# Patient Record
Sex: Female | Born: 1995 | Race: White | Hispanic: No | Marital: Single | Smoking: Current every day smoker
Health system: Southern US, Community
[De-identification: ages and names within clinical notes are randomized; demographics above are authoritative.]

## PROBLEM LIST (undated history)

## (undated) DIAGNOSIS — F1910 Other psychoactive substance abuse, uncomplicated: Secondary | ICD-10-CM

## (undated) DIAGNOSIS — S82143A Displaced bicondylar fracture of unspecified tibia, initial encounter for closed fracture: Secondary | ICD-10-CM

## (undated) DIAGNOSIS — Z789 Other specified health status: Secondary | ICD-10-CM

## (undated) HISTORY — PX: NO PAST SURGERIES: SHX2092

## (undated) HISTORY — PX: BARTHOLIN GLAND CYST EXCISION: SHX565

## (undated) HISTORY — PX: BILATERAL SALPINGECTOMY: SHX5743

---
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
## 2016-01-19 IMAGING — US US OB FOLLOW-UP
1 series · 12 of 28 positions shown · non-contrast
Comparison: none

[Series 1: us ob follow-up · 0.23mm/px · 12 of 44 slices shown]
[im 2/44]
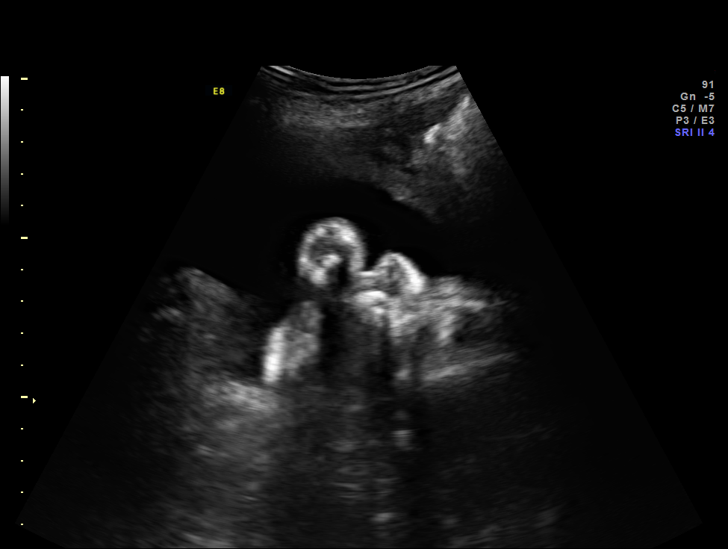
[im 5/44]
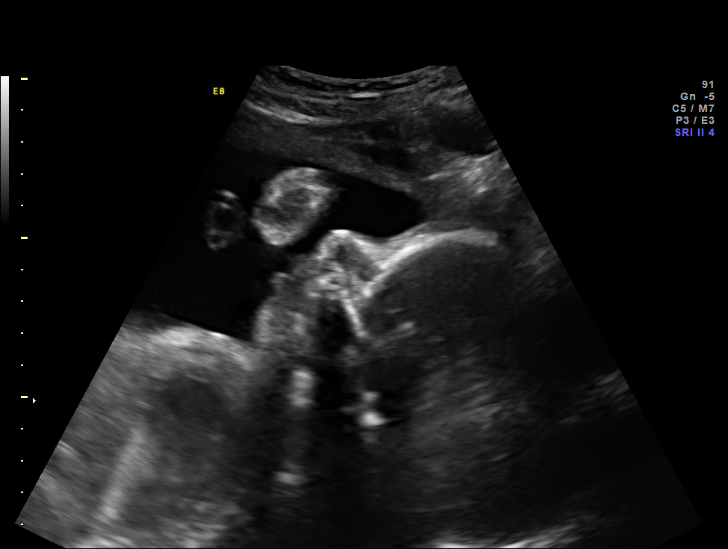
[im 8/44]
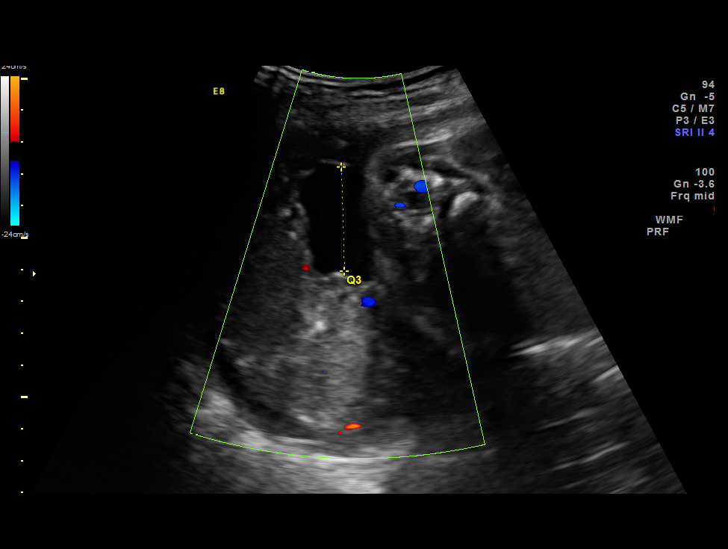
[im 13/44]
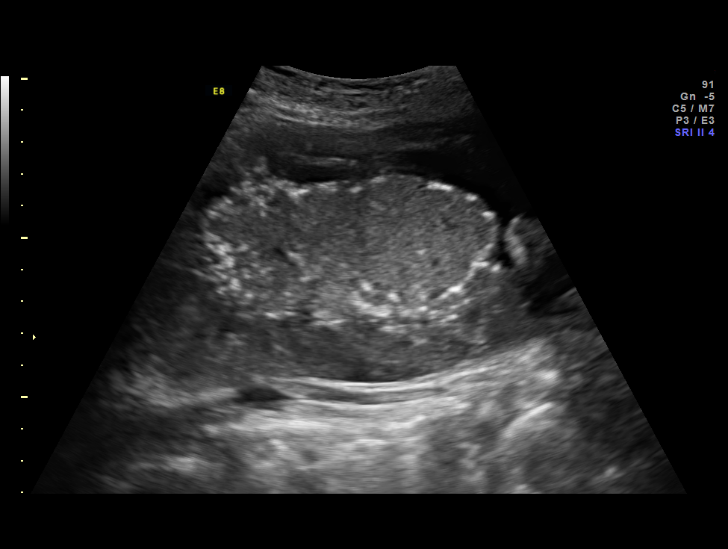
[im 16/44]
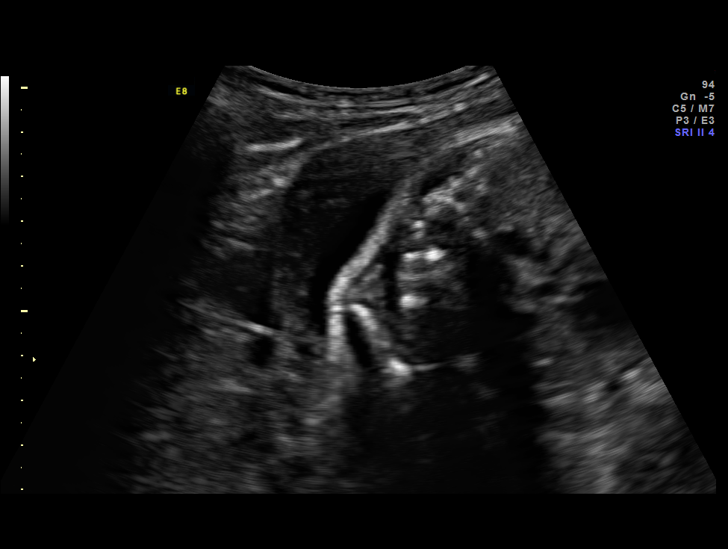
[im 20/44]
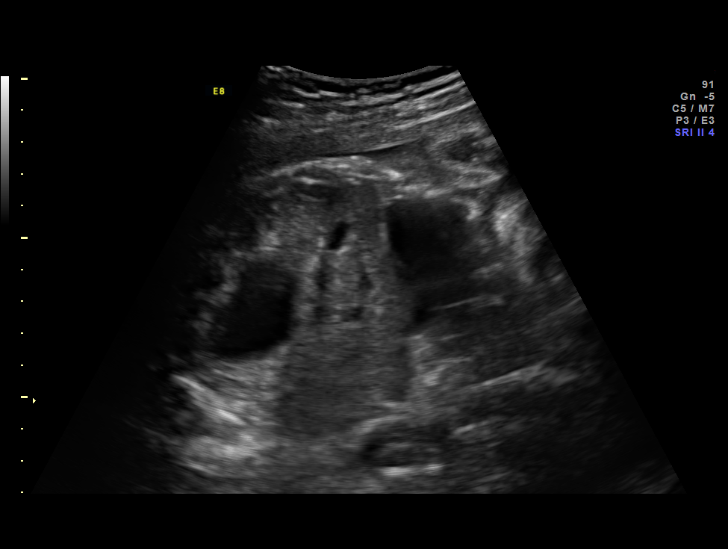
[im 24/44]
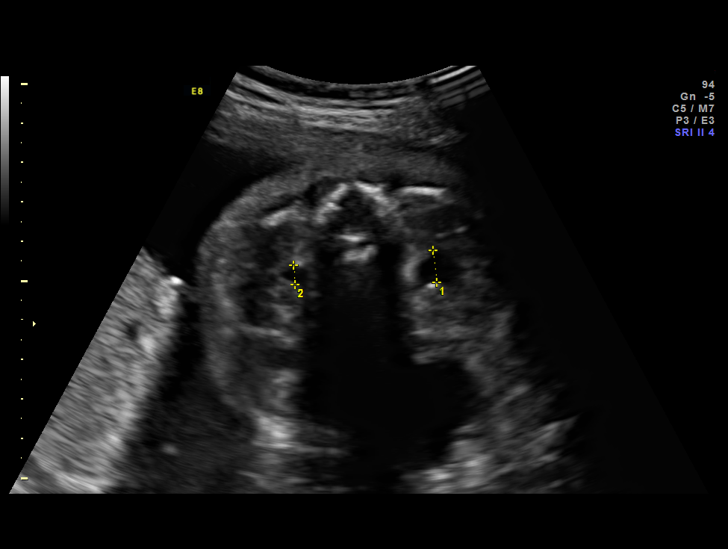
[im 28/44]
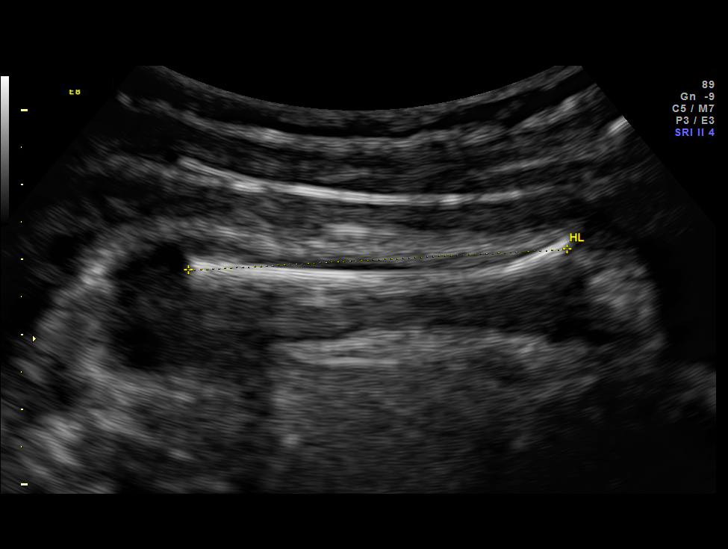
[im 31/44]
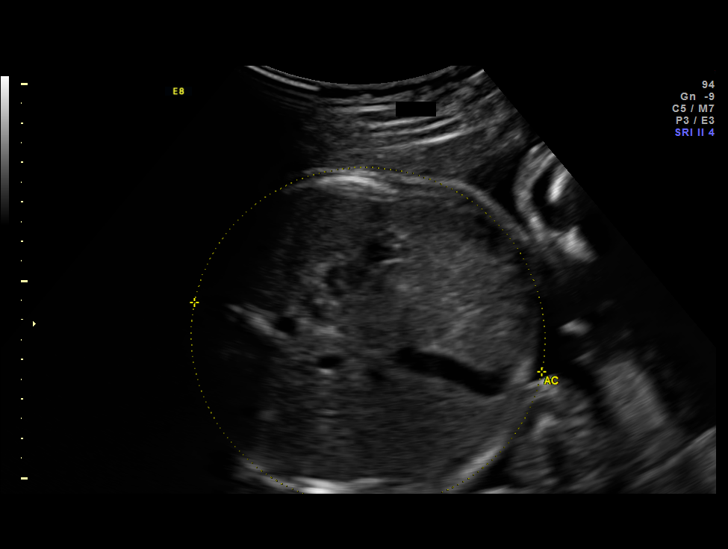
[im 36/44]
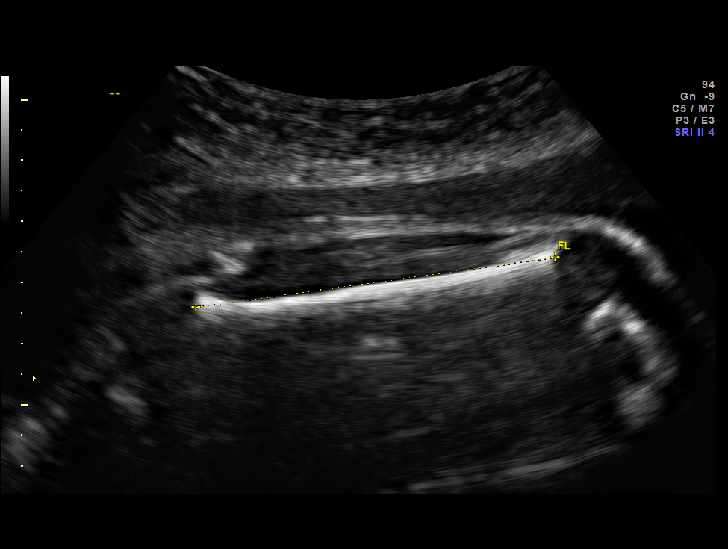
[im 39/44]
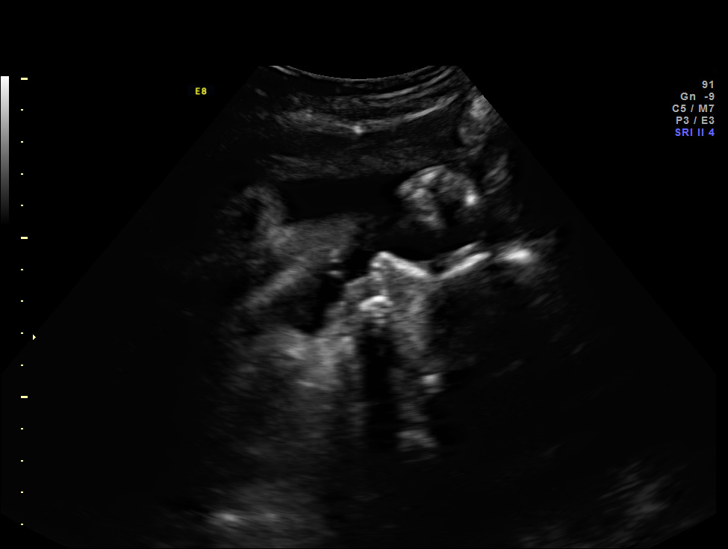
[im 42/44]
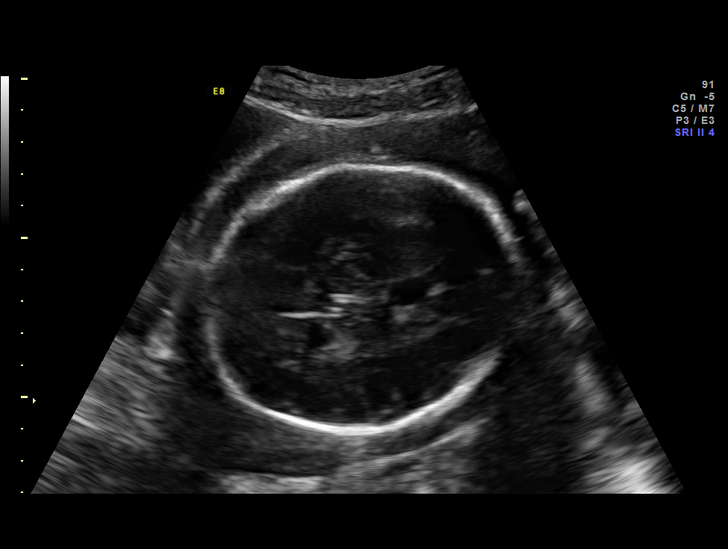

[12 of 28 positions shown; findings below may reference images not displayed]

OBSTETRICS REPORT
                      (Signed Final 01/22/2014 [DATE])

Service(s) Provided

 US OB FOLLOW UP                                       76816.1
Indications

 Hypoplastic left heart (confirmed with fetal ECHO)
 Pyelectasis of fetus on prenatal ultrasound           655.83 593.89,

 Low risk NIPS (trisomies and 22q66)
Fetal Evaluation

 Num Of Fetuses:    1
 Fetal Heart Rate:  127                          bpm
 Cardiac Activity:  Observed
 Presentation:      Cephalic
 Placenta:          Fundal, above cervical os
 P. Cord            Previously Visualized
 Insertion:

 Amniotic Fluid
 AFI FV:      Subjectively within normal limits
 AFI Sum:     15.89   cm       57  %Tile     Larg Pckt:     7.3  cm
 RUQ:   7.3     cm   LUQ:    5.31   cm    LLQ:   3.28    cm
Biometry

 BPD:     79.6  mm     G. Age:  32w 0d                CI:         77.1   70 - 86
 OFD:    103.3  mm                                    FL/HC:      19.8   19.9 -

 HC:       294  mm     G. Age:  32w 4d       14  %    HC/AC:      1.05   0.96 -

 AC:     279.2  mm     G. Age:  32w 0d       33  %    FL/BPD:     73.1   71 - 87
 FL:      58.2  mm     G. Age:  30w 3d      < 3  %    FL/AC:      20.8   20 - 24
 HUM:     51.3  mm     G. Age:  30w 0d      < 5  %
 Est. FW:    3417  gm    3 lb 15 oz      37  %
Gestational Age

 LMP:           34w 5d        Date:  05/24/13                 EDD:   02/28/14
 U/S Today:     31w 5d                                        EDD:   03/21/14
 Best:          32w 4d     Det. By:  Early Ultrasound         EDD:   03/15/14
                                     (10/21/13)
Anatomy

 Cranium:          Appears normal         Diaphragm:        Appears normal
 Fetal Cavum:      Appears normal         Stomach:          Appears normal
 Ventricles:       Appears normal         Abdomen:          Appears normal
 Choroid Plexus:   Previously seen        Abdominal Wall:   Previously seen
 Cerebellum:       Previously seen        Cord Vessels:     Previously seen
 Posterior Fossa:  Appears normal         Kidneys:          Left sided
                                                            pyelectasis, 8 mm
 Nuchal Fold:      Not applicable (>20    Bladder:          Appears normal
                   wks GA)
 Face:             Orbits and profile     Spine:            Previously seen
                   previously seen
 Lips:             Previously seen        Lower             Previously seen
                                          Extremities:
 Heart:            Hypoplastic left       Upper             Previously seen
                   heart
                                          Extremities:

 Other:  Fetus appears to be a male. Heels and 5th digit previously visualized.
Cervix Uterus Adnexa

 Cervix:       Not visualized (advanced GA >72wks)
 Uterus:       No abnormality visualized.
 Cul De Sac:   No free fluid seen.
 Left Ovary:    Not visualized.
 Right Ovary:   Not visualized.
 Adnexa:     No abnormality visualized.
Impression

 SIUP at 32+4 weeks
 Hypoplastic left heart syndrome; mild renal pelvis dilation on
 left
 All other interval fetal anatomy was seen and appeared
 normal
 Normal amniotic fluid volume
 Appropriate interval growth with EFW at the 37th %tile
Recommendations

 Follow-up ultrasound for growth in 4 weeks
 Next ECHO at [REDACTED]

## 2017-02-18 IMAGING — RF DG C-ARM 61-120 MIN
1 series · 7 of 7 positions shown · non-contrast
Comparison: CT scan of the left knee February 09, 2015

CLINICAL DATA: ORIF for left lateral tibial plateau fracture

EXAM:
DG C-ARM 61-120 MIN; LEFT KNEE - COMPLETE 4+ VIEW
FLUOROSCOPY TIME:  Fluoroscopy Time (in minutes and seconds): 1
minutes, 14 seconds
Number of Acquired Images:  7

[Series 1: run · 7 of 7 slices shown]
[im 1/7]
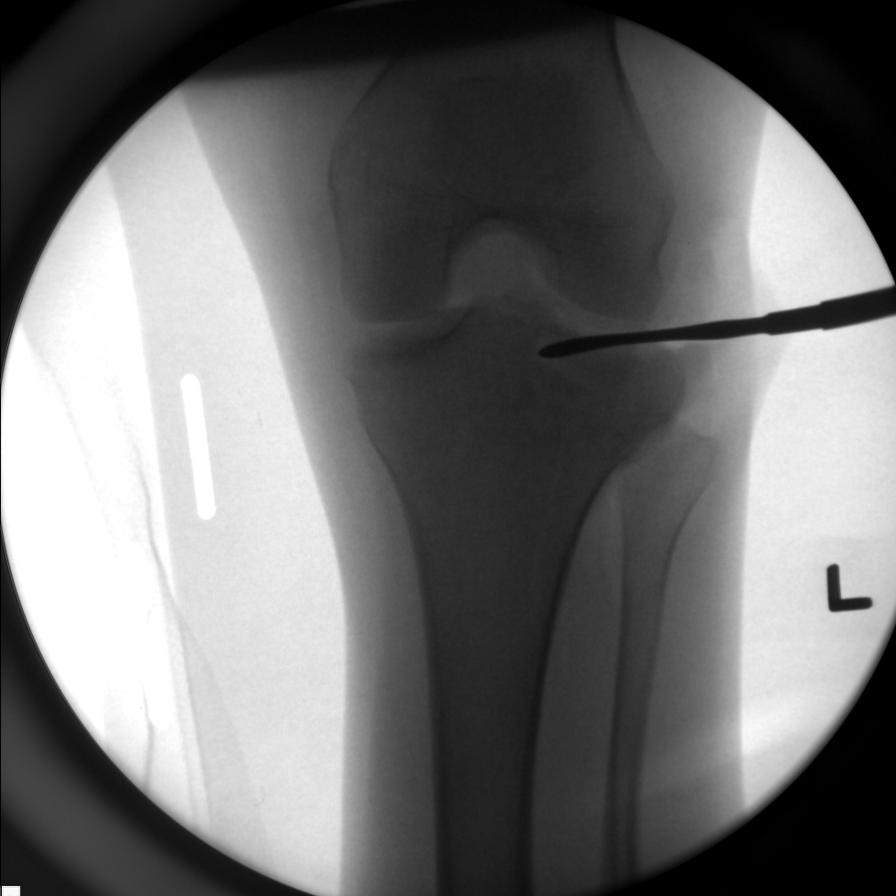
[im 2/7]
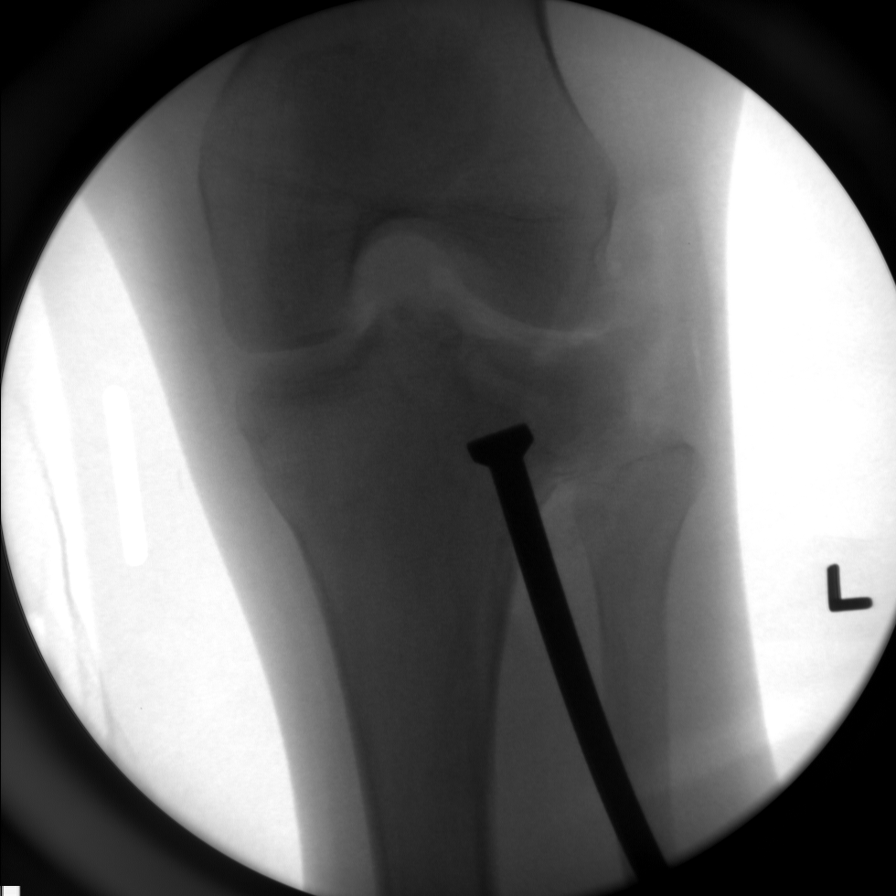
[im 3/7]
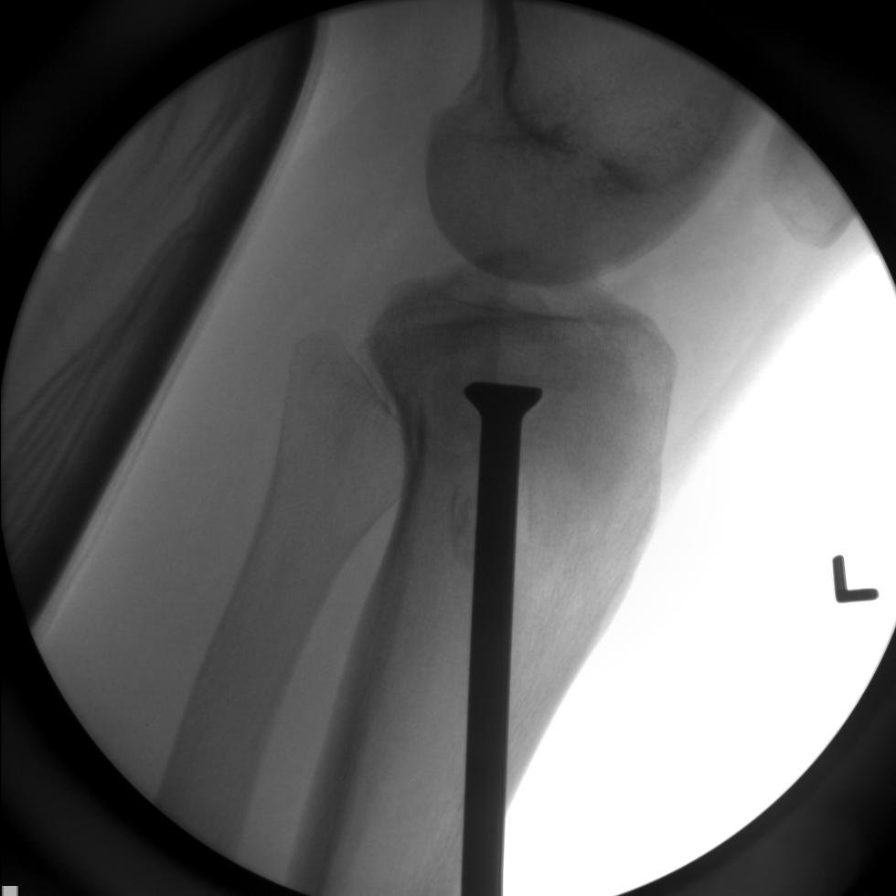
[im 4/7]
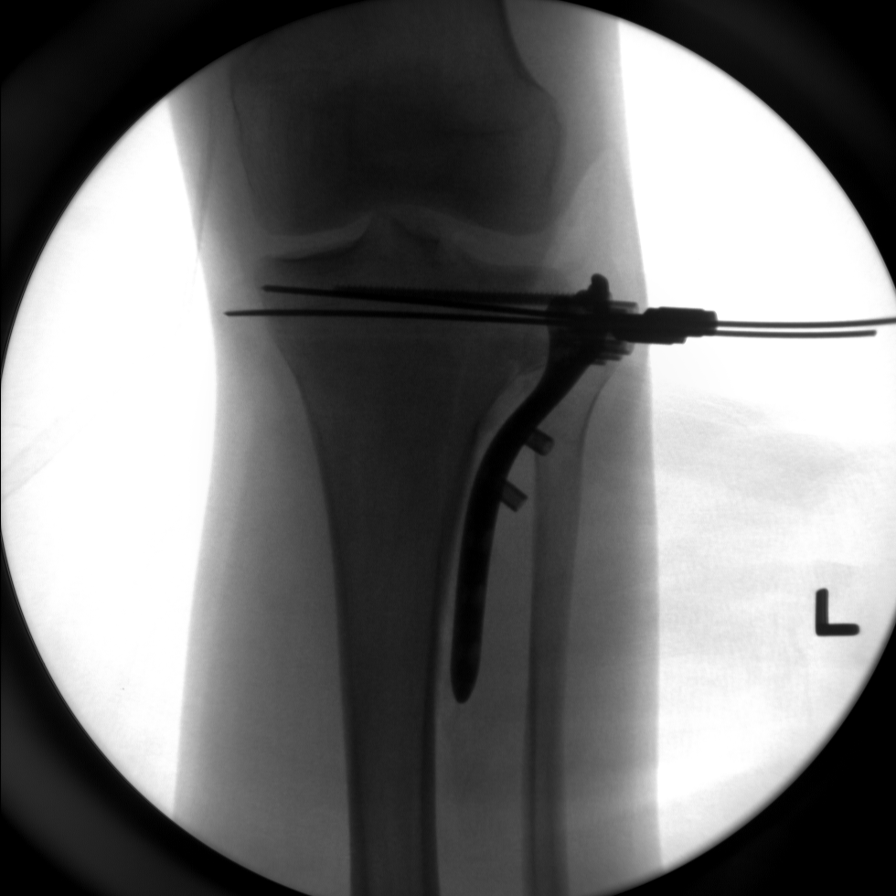
[im 5/7]
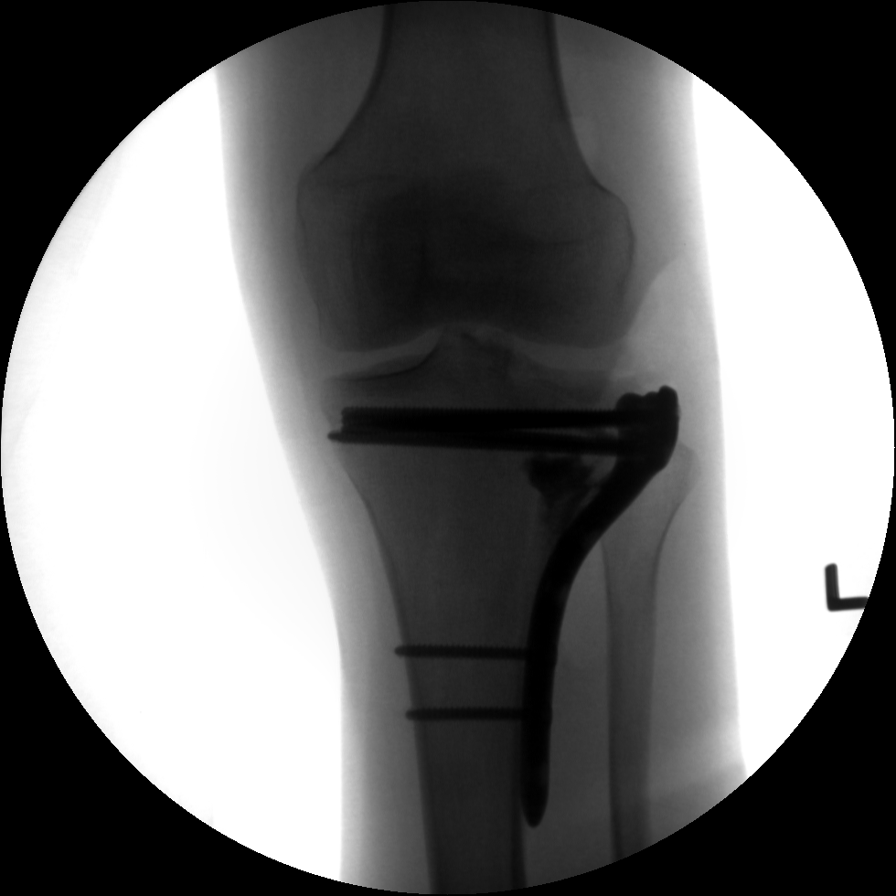
[im 6/7]
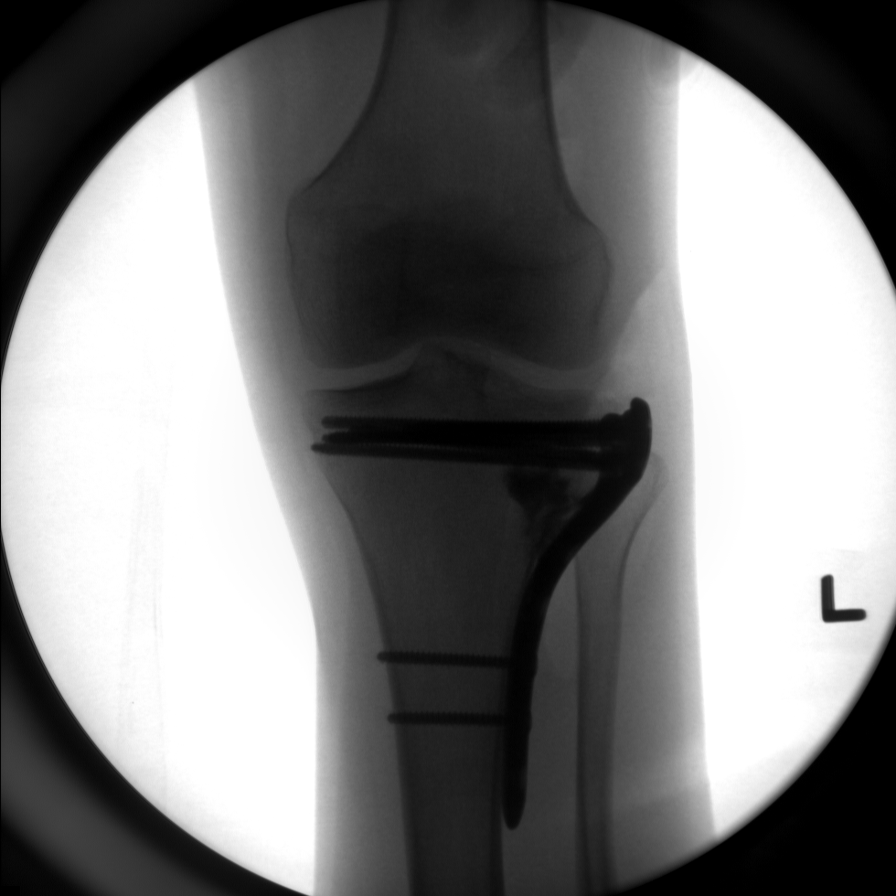
[im 7/7]
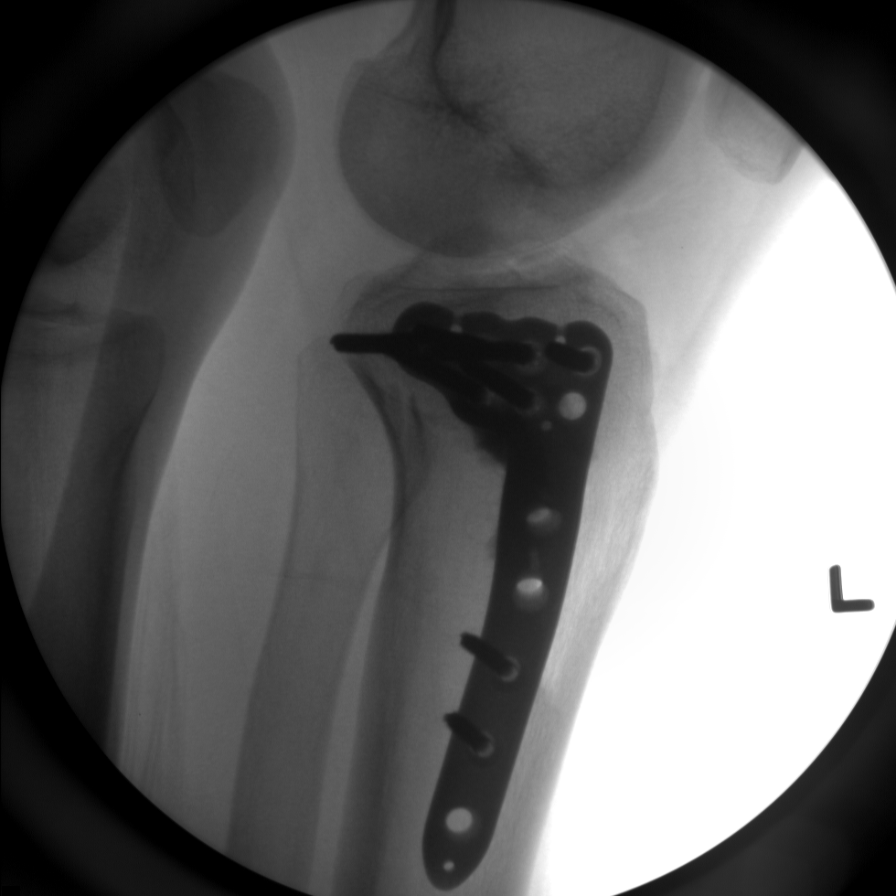

[7 of 7 positions shown; findings below may reference images not displayed]

FINDINGS: This series of fluoro spot images reveals elevation of the depressed
tibial plateau fracture with placement of stabilizing screws and
sideplate along the lateral aspect of the proximal tibia. Radiodense
cement is noted along the inferior aspect of the proximal left
tibial metaphysis laterally.
IMPRESSION: The patient has undergone successful elevation and ORIF of the
depressed tibial plateau fracture. There is no evidence of immediate
postprocedure complication.

## 2021-10-02 ENCOUNTER — Encounter (HOSPITAL_BASED_OUTPATIENT_CLINIC_OR_DEPARTMENT_OTHER): Payer: Self-pay | Admitting: Emergency Medicine

## 2021-10-02 ENCOUNTER — Emergency Department (HOSPITAL_BASED_OUTPATIENT_CLINIC_OR_DEPARTMENT_OTHER)
Admission: EM | Admit: 2021-10-02 | Discharge: 2021-10-02 | Disposition: A | Discharge status: Home / Self Care | Payer: Medicaid Other | Attending: Emergency Medicine | Admitting: Emergency Medicine

## 2021-10-02 ENCOUNTER — Other Ambulatory Visit: Payer: Self-pay

## 2021-10-02 DIAGNOSIS — Z7982 Long term (current) use of aspirin: Secondary | ICD-10-CM | POA: Diagnosis not present

## 2021-10-02 DIAGNOSIS — N7689 Other specified inflammation of vagina and vulva: Secondary | ICD-10-CM | POA: Diagnosis not present

## 2021-10-02 DIAGNOSIS — N9489 Other specified conditions associated with female genital organs and menstrual cycle: Secondary | ICD-10-CM

## 2021-10-02 MED ORDER — OXYCODONE-ACETAMINOPHEN 5-325 MG PO TABS: 1.0000 | ORAL_TABLET | Freq: Four times a day (QID) | ORAL | 0 refills | Status: DC | PRN

## 2021-10-02 MED ORDER — CLINDAMYCIN HCL 300 MG PO CAPS: 300.0000 mg | ORAL_CAPSULE | Freq: Four times a day (QID) | ORAL | 0 refills | Status: AC

## 2021-10-02 NOTE — ED Triage Notes (Signed)
Pt presents to ED POV. Pt c/o abscess on R labia. Pt reports that it started 1.34m and worsened today.

## 2021-10-02 NOTE — ED Provider Notes (Signed)
[facility] EMERGENCY DEPARTMENT Provider Note   CSN: [id number] Arrival date & time: 10/02/21  0042     History  Chief Complaint  Patient presents with   Abscess    [name] is a 26 y.o. female.  Patient here with complaints of large area of swelling to her right labia.  Has been progressively worsening for the last few days.  This significantly worsened today.  No fevers.  No drainage.  No rashes.  She states that some similar happen when she was pregnant and they told her that it was a lymph node.  She states that they were going to do something about it when she gave birth about that time and had improved.  She states that she also has some pain up in her inguinal canal area.   Abscess     Home Medications Prior to Admission medications   Medication Sig Start Date End Date Taking? Authorizing Provider  clindamycin (CLEOCIN) 300 MG capsule Take 1 capsule (300 mg total) by mouth 4 (four) times daily. X 7 days 10/02/21  Yes Rohan Juenger, Barbara Cower, MD  oxyCODONE-acetaminophen (PERCOCET) 5-325 MG tablet Take 1 tablet by mouth every 6 (six) hours as needed for severe pain. 10/02/21  Yes Iyona Pehrson, Barbara Cower, MD  aspirin EC 325 MG EC tablet Take 1 tablet (325 mg total) by mouth 2 (two) times daily. 02/24/15   Montez Morita, PA-C  cholecalciferol 1000 UNITS tablet Take 1 tablet (1,000 Units total) by mouth 2 (two) times daily. 02/24/15   Montez Morita, PA-C  docusate sodium (COLACE) 100 MG capsule Take 1 capsule (100 mg total) by mouth 2 (two) times daily. 02/24/15   Montez Morita, PA-C      Allergies    Tramadol    Review of Systems   Review of Systems  Physical Exam Updated Vital Signs BP 114/82 (BP Location: Right Arm)    Pulse 78    Temp 98 F (36.7 C) (Oral)    Resp 16    Ht 5\' 4"  (1.626 m)    SpO2 99%    BMI 18.28 kg/m  Physical Exam Vitals and nursing note reviewed. Exam conducted with a chaperone present.  Constitutional:      Appearance: She is well-developed.  HENT:      Head: Normocephalic and atraumatic.     Mouth/Throat:     Mouth: Mucous membranes are moist.     Pharynx: Oropharynx is clear.  Eyes:     Pupils: Pupils are equal, round, and reactive to light.  Cardiovascular:     Rate and Rhythm: Normal rate and regular rhythm.  Pulmonary:     Effort: No respiratory distress.     Breath sounds: No stridor.  Abdominal:     General: Abdomen is flat. There is no distension.  Genitourinary:      Comments: Chaperoned by .  Significant swelling to her right labia without obvious fluctuant area.  Mildly erythematous.  She also has lymphadenopathy in her right inguinal canal.  No vaginal discharge. Musculoskeletal:     Cervical back: Normal range of motion.  Skin:    General: Skin is warm and dry.  Neurological:     General: No focal deficit present.     Mental Status: She is alert.    ED Results / Procedures / Treatments   Labs (all labs ordered are listed, but only abnormal results are displayed) Labs Reviewed - No data to display  EKG None  Radiology No results found.  Procedures Procedures   Medications Ordered in ED Medications - No data to display  ED Course/ Medical Decision Making/ A&P                           Medical Decision Making  Not entirely sure that this is a Bartholin gland cyst or abscess.  The amount of swelling is well beyond where her Bartholin gland would be.  She does have lymphadenopathy proximal to it and swelling and tenderness with mild erythema concerning for possible infection.  I do not see an area that I am confident in performing I&D at this time.  Will refer to gynecology for follow-up.  Antibiotics given for the meantime    Final Clinical Impression(s) / ED Diagnoses Final diagnoses:  Labial swelling    Rx / DC Orders ED Discharge Orders          Ordered    oxyCODONE-acetaminophen (PERCOCET) 5-325 MG tablet  Every 6 hours PRN        10/02/21 0456    clindamycin (CLEOCIN) 300 MG  capsule  4 times daily        10/02/21 0456              Leanore Biggers, Barbara Cower, MD 10/02/21 3893

## 2021-10-02 NOTE — ED Notes (Signed)
Patient discharged to home.  All discharge instructions reviewed.  Patient verbalized understanding via teachback method.  VS WDL.  Respirations even and unlabored.  Ambulatory out of ED.   °

## 2021-10-02 NOTE — ED Notes (Signed)
Chaperoned ED physician during pelvic exam.

## 2021-10-02 NOTE — ED Notes (Signed)
Patient resting in bed with eyes closed.  Respirations even and unlabored.  Pelvic cart and I&D tray to bedside.

## 2021-10-02 NOTE — ED Notes (Signed)
Patient sleeping in chair in lobby.  Attempted to wake up patient but she did not respond to verbal stimuli.  Required vigorously shaking her harm to wake her up.  Independent with ambulation to room.  Patient encouraged to put on hospital gown.  Denies further needs at this time.

## 2021-10-03 ENCOUNTER — Other Ambulatory Visit: Payer: Self-pay

## 2021-10-03 ENCOUNTER — Emergency Department (HOSPITAL_BASED_OUTPATIENT_CLINIC_OR_DEPARTMENT_OTHER)
Admission: EM | Admit: 2021-10-03 | Discharge: 2021-10-03 | Disposition: A | Discharge status: Home / Self Care | Payer: Medicaid Other | Attending: Emergency Medicine | Admitting: Emergency Medicine

## 2021-10-03 ENCOUNTER — Encounter (HOSPITAL_BASED_OUTPATIENT_CLINIC_OR_DEPARTMENT_OTHER): Payer: Self-pay

## 2021-10-03 DIAGNOSIS — N750 Cyst of Bartholin's gland: Secondary | ICD-10-CM | POA: Diagnosis not present

## 2021-10-03 DIAGNOSIS — Z7982 Long term (current) use of aspirin: Secondary | ICD-10-CM | POA: Insufficient documentation

## 2021-10-03 DIAGNOSIS — N7689 Other specified inflammation of vagina and vulva: Secondary | ICD-10-CM | POA: Diagnosis present

## 2021-10-03 DIAGNOSIS — R590 Localized enlarged lymph nodes: Secondary | ICD-10-CM | POA: Diagnosis not present

## 2021-10-03 DIAGNOSIS — N9489 Other specified conditions associated with female genital organs and menstrual cycle: Secondary | ICD-10-CM

## 2021-10-03 MED ORDER — OXYCODONE-ACETAMINOPHEN 5-325 MG PO TABS: 1.0000 | ORAL_TABLET | Freq: Three times a day (TID) | ORAL | 0 refills | Status: AC | PRN

## 2021-10-03 MED ORDER — OXYCODONE-ACETAMINOPHEN 5-325 MG PO TABS: 1.0000 | ORAL_TABLET | Freq: Three times a day (TID) | ORAL | 0 refills | Status: DC | PRN

## 2021-10-03 MED ORDER — OXYCODONE-ACETAMINOPHEN 5-325 MG PO TABS
1.0000 | ORAL_TABLET | Freq: Once | ORAL | Status: AC
  Administered 2021-10-03: 1 via ORAL
  Filled 2021-10-03: qty 1

## 2021-10-03 MED ORDER — LIDOCAINE HCL (PF) 1 % IJ SOLN
5.0000 mL | Freq: Once | INTRAMUSCULAR | Status: AC
  Administered 2021-10-03: 5 mL
  Filled 2021-10-03: qty 5

## 2021-10-03 NOTE — ED Provider Notes (Signed)
[facility] EMERGENCY DEPARTMENT Provider Note   CSN: [id number] Arrival date & time: 10/03/21  1939     History  Chief Complaint  Patient presents with   Vaginal Pain    [name] is a 26 y.o. female.   Vaginal Pain Patient presents with labial pain and swelling.  Had been seen yesterday for same.  Started on antibiotics and pain medicines.  States continued pain and swelling.  No relief.  Began a few days ago.  No drainage.  States she had similar symptoms 5 months ago when she was pregnant and it resolved on its own.  Also has some swelling in her groin with lymph nodes.     Home Medications Prior to Admission medications   Medication Sig Start Date End Date Taking? Authorizing Provider  aspirin EC 325 MG EC tablet Take 1 tablet (325 mg total) by mouth 2 (two) times daily. 02/24/15   Montez Morita, PA-C  cholecalciferol 1000 UNITS tablet Take 1 tablet (1,000 Units total) by mouth 2 (two) times daily. 02/24/15   Montez Morita, PA-C  clindamycin (CLEOCIN) 300 MG capsule Take 1 capsule (300 mg total) by mouth 4 (four) times daily. X 7 days 10/02/21   Mesner, Barbara Cower, MD  docusate sodium (COLACE) 100 MG capsule Take 1 capsule (100 mg total) by mouth 2 (two) times daily. 02/24/15   Montez Morita, PA-C  oxyCODONE-acetaminophen (PERCOCET) 5-325 MG tablet Take 1-2 tablets by mouth every 8 (eight) hours as needed for severe pain. 10/03/21   Benjiman Core, MD      Allergies    Tramadol    Review of Systems   Review of Systems  Genitourinary:  Positive for vaginal pain. Negative for flank pain.   Physical Exam Updated Vital Signs BP 128/82 (BP Location: Left Arm)    Pulse 93    Temp 98.1 F (36.7 C) (Oral)    Resp 18    Ht 5\' 4"  (1.626 m)    Wt 48 kg    LMP 09/29/2021    SpO2 100%    BMI 18.16 kg/m  Physical Exam Vitals reviewed.  HENT:     Head: Normocephalic.  Abdominal:     Tenderness: There is no abdominal tenderness.  Genitourinary:    Comments: Tender firm  area somewhat deep in the right labia majora.  Approximately 1/2 cm x 2 cm.  No fluctuance. Neurological:     General: No focal deficit present.     Mental Status: She is alert.    ED Results / Procedures / Treatments   Labs (all labs ordered are listed, but only abnormal results are displayed) Labs Reviewed - No data to display  EKG None  Radiology No results found.  Procedures .01/15/2023Incision and Drainage  Date/Time: 10/03/2021 10:04 PM Performed by: 10/05/2021, MD Authorized by: Benjiman Core, MD   Consent:    Consent obtained:  Verbal   Consent given by:  Patient   Risks, benefits, and alternatives were discussed: yes     Risks discussed:  Bleeding, infection, pain, incomplete drainage and damage to other organs   Alternatives discussed:  No treatment and delayed treatment Location:    Type:  Bartholin cyst   Size:  2   Location:  Anogenital   Anogenital location:  Bartholin's gland Pre-procedure details:    Skin preparation:  Povidone-iodine Sedation:    Sedation type:  None Anesthesia:    Anesthesia method:  Local infiltration   Local anesthetic:  Lidocaine 1% w/o  epi Procedure type:    Complexity:  Simple Procedure details:    Ultrasound guidance: no     Needle aspiration: no     Incision types:  Stab incision   Incision depth:  Subcutaneous   Drainage:  Bloody   Drainage amount:  Scant   Wound treatment:  Wound left open   Packing materials:  None Post-procedure details:    Procedure completion:  Tolerated well, no immediate complications Comments:     No real reduction in size of abscess or much drainage at all.    Medications Ordered in ED Medications  oxyCODONE-acetaminophen (PERCOCET/ROXICET) 5-325 MG per tablet 1 tablet (has no administration in time range)  lidocaine (PF) (XYLOCAINE) 1 % injection 5 mL (5 mLs Infiltration Given 10/03/21 2132)    ED Course/ Medical Decision Making/ A&P                           Medical Decision  Making Risk Prescription drug management.   Tender firm area on right labia.  Continued pain.  Seen yesterday for same.  Attempted incision and drainage done with no purulent drainage.  Will give continued pain medicine.  Continue antibiotics.  Follow-up with GYN.  Large cellulitis felt less likely.  Potentially Bartholin's but no drainage.  Will need GYN for further follow-up.  Initial differential diagnosis included cellulitis/abscess/mass.  Does not appear to need CT imaging at this time.        Final Clinical Impression(s) / ED Diagnoses Final diagnoses:  Labial swelling    Rx / DC Orders ED Discharge Orders          Ordered    oxyCODONE-acetaminophen (PERCOCET) 5-325 MG tablet  Every 8 hours PRN        10/03/21 2200              Benjiman Core, MD 10/03/21 2205

## 2021-10-03 NOTE — ED Triage Notes (Signed)
Pt c/o cont'd vaginal pain-states she was seen here for same yesterday-states she was advised area is too large and was advised to follow up with GYN-states she has appt 1/20-states she is taking abx and is out of pain med-states she is unable to tolerate the pain-NAD-steady gait

## 2021-10-03 NOTE — Discharge Instructions (Addendum)
There is a firm area in your labia.  Likely is a Bartholin's gland but no drainage came out.  Follow-up with GYN for further evaluation of this.  Continue the antibiotics.

## 2021-10-05 ENCOUNTER — Encounter: Payer: Medicaid Other | Admitting: Family Medicine

## 2023-02-03 ENCOUNTER — Emergency Department (HOSPITAL_BASED_OUTPATIENT_CLINIC_OR_DEPARTMENT_OTHER)
Admission: EM | Admit: 2023-02-03 | Discharge: 2023-02-03 | Discharge status: Against Medical Advice | Payer: Medicaid Other | Attending: Emergency Medicine | Admitting: Emergency Medicine

## 2023-02-03 ENCOUNTER — Encounter (HOSPITAL_BASED_OUTPATIENT_CLINIC_OR_DEPARTMENT_OTHER): Payer: Self-pay | Admitting: Emergency Medicine

## 2023-02-03 DIAGNOSIS — Z5321 Procedure and treatment not carried out due to patient leaving prior to being seen by health care provider: Secondary | ICD-10-CM | POA: Insufficient documentation

## 2023-02-03 DIAGNOSIS — H05221 Edema of right orbit: Secondary | ICD-10-CM | POA: Diagnosis present

## 2023-02-03 NOTE — ED Triage Notes (Signed)
Rt periorbital edema, redness and pain since yesterday; pt sts swelling increased significantly today
